# Patient Record
Sex: Male | Born: 2000 | Race: White | Hispanic: No | Marital: Single | Smoking: Never smoker
Health system: Southern US, Community
[De-identification: ages and names within clinical notes are randomized; demographics above are authoritative.]

---
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
## 2018-05-06 DIAGNOSIS — Q825 Congenital non-neoplastic nevus: Secondary | ICD-10-CM | POA: Diagnosis not present

## 2020-06-27 ENCOUNTER — Telehealth (INDEPENDENT_AMBULATORY_CARE_PROVIDER_SITE_OTHER): Payer: BLUE CROSS/BLUE SHIELD | Admitting: Adult Health

## 2020-06-27 ENCOUNTER — Encounter: Payer: Self-pay | Admitting: Adult Health

## 2020-06-27 DIAGNOSIS — M79604 Pain in right leg: Secondary | ICD-10-CM | POA: Insufficient documentation

## 2020-06-27 DIAGNOSIS — J069 Acute upper respiratory infection, unspecified: Secondary | ICD-10-CM | POA: Insufficient documentation

## 2020-06-27 NOTE — Patient Instructions (Signed)
[facility]  Located in: [facility] Address: [address], [place], [place] [address] Closes 8PM Phone: [phone]   Leg Cramps Leg cramps occur when one or more muscles tighten and you have no control over this tightening (involuntary muscle contraction). Muscle cramps can develop in any muscle, but the most common place is in the calf muscles of the leg. Those cramps can occur during exercise or when you are at rest. Leg cramps are painful, and they may last for a few seconds to a few minutes. Cramps may return several times before they finally stop. Usually, leg cramps are not caused by a serious medical problem. In many cases, the cause is not known. Some common causes include:  Excessive physical effort (overexertion), such as during intense exercise.  Overuse from repetitive motions, or doing the same thing over and over.  Staying in a certain position for a long period of time.  Improper preparation, form, or technique while performing a sport or an activity.  Dehydration.  Injury.  Side effects of certain medicines.  Abnormally low levels of minerals in your blood (electrolytes), especially potassium and calcium. This could result from: ? Pregnancy. ? Taking diuretic medicines. Follow these instructions at home: Eating and drinking  Drink enough fluid to keep your urine pale yellow. Staying hydrated may help prevent cramps.  Eat a healthy diet that includes plenty of nutrients to help your muscles function. A healthy diet includes fruits and vegetables, lean protein, whole grains, and low-fat or nonfat dairy products. Managing pain, stiffness, and swelling      Try massaging, stretching, and relaxing the affected muscle. Do this for several minutes at a time.  If directed, put ice on areas that are sore or painful after a cramp: ? Put ice in a plastic bag. ? Place a towel between your skin and the bag. ? Leave the ice on for 20  minutes, 2-3 times a day.  If directed, apply heat to muscles that are tense or tight. Do this before you exercise, or as often as told by your health care provider. Use the heat source that your health care provider recommends, such as a moist heat pack or a heating pad. ? Place a towel between your skin and the heat source. ? Leave the heat on for 20-30 minutes. ? Remove the heat if your skin turns bright red. This is especially important if you are unable to feel pain, heat, or cold. You may have a greater risk of getting burned.  Try taking hot showers or baths to help relax tight muscles. General instructions  If you are having frequent leg cramps, avoid intense exercise for several days.  Take over-the-counter and prescription medicines only as told by your health care provider.  Keep all follow-up visits as told by your health care provider. This is important. Contact a health care provider if:  Your leg cramps get more severe or more frequent, or they do not improve over time.  Your foot becomes cold, numb, or blue. Summary  Muscle cramps can develop in any muscle, but the most common place is in the calf muscles of the leg.  Leg cramps are painful, and they may last for a few seconds to a few minutes.  Usually, leg cramps are not caused by a serious medical problem. Often, the cause is not known.  Stay hydrated and take over-the-counter and prescription medicines only as told by your health care provider. This information is  not intended to replace advice given to you by your health care provider. Make sure you discuss any questions you have with your health care provider. Document Revised: 06/20/2017 Document Reviewed: 04/17/2017 Elsevier Patient Education  2020 [employer].

## 2020-06-27 NOTE — Progress Notes (Signed)
MyChart Video Visit    Virtual Visit via Video Note   This visit type was conducted due to national recommendations for restrictions regarding the COVID-19 Pandemic (e.g. social distancing) in an effort to limit this patient's exposure and mitigate transmission in our community. This patient is at least at moderate risk for complications without adequate follow up. This format is felt to be most appropriate for this patient at this time. Physical exam was limited by quality of the video and audio technology used for the visit.    Parties involved in visit as below:    Patient location: at home  Provider location: Provider: Provider's office at  [facility], [place].     I discussed the limitations of evaluation and management by telemedicine and the availability of in person appointments. The patient expressed understanding and agreed to proceed.  Patient: Joseph Braun   DOB: 2001/06/22   19 y.o. Male  MRN: [id number] Visit Date: 06/27/2020  Today's healthcare provider: Jairo Ben, FNP   Chief Complaint  Patient presents with  . Leg Pain   Subjective    Leg Pain  The incident occurred 12 to 24 hours ago. There was no injury mechanism. The pain is present in the right leg. The pain is moderate. Pertinent negatives include no inability to bear weight, loss of motion, loss of sensation, muscle weakness, numbness or tingling.   He had pain in right knee, and lower leg right. Denies any injury or trauma.  Denies any warmth or swelling.   He reports he has had a fever around 101.   He has a slight cough and tickle in his throat. Has headache.   He has not been seen in the office in over 2 years, previous PCP was Anola Gurney, [place]  Works in retail so many exposures.   Patient  denies any fever, body aches,chills, rash, chest pain, shortness of breath, nausea, vomiting, or diarrhea.     Patient Active Problem List   Diagnosis Date Noted    . Upper respiratory tract infection 06/27/2020  . Right leg pain 06/27/2020  . 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 Plantar wart 08/29/2009   History reviewed. No pertinent past medical history. Past Surgical History:  Procedure Laterality Date  . APPENDECTOMY  06/18/2010   Dr. Michela Pitcher   Social History   Tobacco Use  . Smoking status: Never Smoker  . Smokeless tobacco: Never Used  Substance Use Topics  . Alcohol use: No    Alcohol/week: 0.0 standard drinks  . Drug use: No   Social History   Socioeconomic History  . Marital status: Single    Spouse name: Not on file  . Number of children: Not on file  . Years of education: Not on file  . Highest education level: Not on file  Occupational History  . Not on file  Tobacco Use  . Smoking status: Never Smoker  . Smokeless tobacco: Never Used  Substance and Sexual Activity  . Alcohol use: No    Alcohol/week: 0.0 standard drinks  . Drug use: No  . Sexual activity: Never  Other Topics Concern  . Not on file  Social History Narrative  . Not on file   Social Determinants of Health   Financial Resource Strain:   . Difficulty of Paying Living Expenses: Not on file  Food Insecurity:   . Worried About Programme researcher, broadcasting/film/video in the Last Year: Not on file  . Ran Out of  Food in the Last Year: Not on file  Transportation Needs:   . Lack of Transportation (Medical): Not on file  . Lack of Transportation (Non-Medical): Not on file  Physical Activity:   . Days of Exercise per Week: Not on file  . Minutes of Exercise per Session: Not on file  Stress:   . Feeling of Stress : Not on file  Social Connections:   . Frequency of Communication with Friends and Family: Not on file  . Frequency of Social Gatherings with Friends and Family: Not on file  . Attends Religious Services: Not on file  . Active Member of Clubs or Organizations: Not on file  . Attends Banker Meetings: Not on file  . Marital Status: Not on file  Intimate  Partner Violence:   . Fear of Current or Ex-Partner: Not on file  . Emotionally Abused: Not on file  . Physically Abused: Not on file  . Sexually Abused: Not on file   Family Status  Relation Name Status  . PGF  Deceased       cancer  . Mother  Alive  . Father  Alive  . Brother ##Brother1 Alive   Family History  Problem Relation Age of Onset  . Cancer Paternal Grandfather        lung  . Healthy Mother   . Healthy Father   . Healthy Brother    No Known Allergies    Medications: No outpatient medications prior to visit.   No facility-administered medications prior to visit.    Review of Systems  Constitutional: Positive for activity change and fatigue. Negative for appetite change, chills, diaphoresis, fever and unexpected weight change.  Respiratory: Positive for cough. Negative for apnea, choking, chest tightness, shortness of breath, wheezing and stridor.   Cardiovascular: Negative.   Gastrointestinal: Negative.   Genitourinary: Negative.   Musculoskeletal:       Right leg pain. No injury. Knee to lower leg.   Skin: Negative for color change and rash.  Neurological: Negative.  Negative for tingling and numbness.  Psychiatric/Behavioral: Negative.       Objective    There were no vitals taken for this visit.   Physical Exam    Patient is alert and oriented and responsive to questions Engages in conversation with provider. Speaks in full sentences without any pauses without any shortness of breath or distress.     Assessment & Plan     Right leg pain  Upper respiratory tract infection, unspecified type   He is going to go to [facility] urgent care for in person leg evaluation to be sure no DVT work up needed. Can be tested for Covid, flu at urgent care and evaluation.  He will go to [facility] urgent care now.   Red Flags discussed. The patient was given clear instructions to go to ER or return to medical center if any red flags develop, symptoms do not  improve, worsen or new problems develop. They verbalized understanding.  Follow up with office after is advised.    Recommend follow up with new provider to establish care since [place], [place] no longer at office.   Return in about 1 week (around 07/04/2020), or if symptoms worsen or fail to improve, for at any time for any worsening symptoms, Go to Emergency room/ urgent care if worse.     I discussed the assessment and treatment plan with the patient. The patient was provided an opportunity to ask questions and all were  answered. The patient agreed with the plan and demonstrated an understanding of the instructions.   The patient was advised to call back or seek an in-person evaluation if the symptoms worsen or if the condition fails to improve as anticipated.    Jairo Ben, FNP [facility] [phone] (phone) [phone] (fax)  [facility]

## 2020-06-28 ENCOUNTER — Ambulatory Visit
Admission: RE | Admit: 2020-06-28 | Discharge: 2020-06-28 | Disposition: A | Discharge status: Home / Self Care | Payer: BC Managed Care – PPO | Source: Ambulatory Visit | Attending: Family Medicine | Admitting: Family Medicine

## 2020-06-28 ENCOUNTER — Other Ambulatory Visit: Payer: Self-pay

## 2020-06-28 ENCOUNTER — Ambulatory Visit (INDEPENDENT_AMBULATORY_CARE_PROVIDER_SITE_OTHER): Payer: BC Managed Care – PPO

## 2020-06-28 VITALS — BP 124/95 | HR 89 | Temp 97.9°F | Resp 18 | Ht 76.0 in | Wt 260.0 lb

## 2020-06-28 DIAGNOSIS — R509 Fever, unspecified: Secondary | ICD-10-CM

## 2020-06-28 DIAGNOSIS — U071 COVID-19: Secondary | ICD-10-CM

## 2020-06-28 DIAGNOSIS — R059 Cough, unspecified: Secondary | ICD-10-CM

## 2020-06-28 LAB — RESP PANEL BY RT-PCR (FLU A&B, COVID) ARPGX2
Influenza A by PCR: NEGATIVE
Influenza B by PCR: NEGATIVE
SARS Coronavirus 2 by RT PCR: POSITIVE — AB

## 2020-06-28 MED ORDER — PROMETHAZINE-DM 6.25-15 MG/5ML PO SYRP: 5.0000 mL | ORAL_SOLUTION | Freq: Four times a day (QID) | ORAL | 0 refills | Status: AC | PRN

## 2020-06-28 MED ORDER — BENZONATATE 100 MG PO CAPS: 200.0000 mg | ORAL_CAPSULE | Freq: Three times a day (TID) | ORAL | 0 refills | Status: AC

## 2020-06-28 NOTE — ED Triage Notes (Signed)
Patient states that he has been having a fever, joint pain, headache and fatigue x 2 days.

## 2020-06-28 NOTE — Discharge Instructions (Signed)
You were positive for Covid.  You need to quarantine for 10 days from the start of your symptoms.  After the 10 days you can break quarantine if your symptoms have improved and you have not had a fever for 24 hours without the use of Tylenol or Advil.  Use the Tessalon Perles every 8 hours as needed for cough.  You can use the Promethazine DM every 6 hours as needed for severe cough and congestion.  Use over-the-counter Tylenol and ibuprofen as needed for body aches, fever, and headache.  If you develop worsening of symptoms, especially shortness of breath or shortness of breath at rest, you cannot speak in full sentences, or you develop bluing of your lips you need to go to the ER for evaluation.

## 2020-06-28 NOTE — ED Provider Notes (Signed)
[facility]    CSN: [id number] Arrival date & time: 06/28/20  1148      History   Chief Complaint Chief Complaint  Patient presents with  . Fever    HPI Joseph Braun is a 19 y.o. male.   HPI   19 year old male here for evaluation of fever, headache, sinus pressure, runny nose, ear pressure, sore throat, dry cough, fatigue, nausea, and changes to his sense of taste and smell.  Patient denies shortness of breath or wheezing.  Patient also denies vomiting or diarrhea.  Patient has not had his Covid vaccine or his flu shot.  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  History reviewed. No pertinent past medical history.  Patient Active Problem List   Diagnosis Date Noted  . Upper respiratory tract infection 06/27/2020  . Right leg pain 06/27/2020  . 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 Plantar wart 08/29/2009    Past Surgical History:  Procedure Laterality Date  . APPENDECTOMY  06/18/2010   Dr. Michela Pitcher       Home Medications    Prior to Admission medications   Medication Sig Start Date End Date Taking? Authorizing Provider  benzonatate (TESSALON) 100 MG capsule Take 2 capsules (200 mg total) by mouth every 8 (eight) hours. 06/28/20   Becky Augusta, NP  promethazine-dextromethorphan (PROMETHAZINE-DM) 6.25-15 MG/5ML syrup Take 5 mLs by mouth 4 (four) times daily as needed. 06/28/20   Becky Augusta, NP    Family History Family History  Problem Relation Age of Onset  . Cancer Paternal Grandfather        lung  . Healthy Mother   . Healthy Father   . Healthy Brother     Social History Social History   Tobacco Use  . Smoking status: Never Smoker  . Smokeless tobacco: Never Used  Vaping Use  . Vaping Use: Never used  Substance Use Topics  . Alcohol use: No    Alcohol/week: 0.0 standard drinks  . Drug use: No     Allergies   Patient has no known allergies.   Review of Systems Review of Systems  Constitutional: Positive for  fatigue and fever. Negative for activity change and appetite change.  HENT: Positive for congestion, ear pain, rhinorrhea, sinus pressure and sore throat.   Respiratory: Positive for cough. Negative for shortness of breath and wheezing.   Cardiovascular: Negative for chest pain.  Gastrointestinal: Positive for nausea. Negative for diarrhea and vomiting.  Musculoskeletal: Positive for arthralgias and myalgias.  Skin: Negative for rash.  Neurological: Positive for headaches.  Hematological: Negative.   Psychiatric/Behavioral: Negative.      Physical Exam Triage Vital Signs ED Triage Vitals  Enc Vitals Group     BP 06/28/20 1251 (!) 124/95     Pulse Rate 06/28/20 1251 89     Resp 06/28/20 1251 18     Temp 06/28/20 1251 97.9 F (36.6 C)     Temp Source 06/28/20 1251 Oral     SpO2 06/28/20 1251 95 %     Weight 06/28/20 1250 260 lb (117.9 kg)     Height 06/28/20 1250 6\' 4"  (1.93 m)     Head Circumference --      Peak Flow --      Pain Score 06/28/20 1250 4     Pain Loc --      Pain Edu? --      Excl. in GC? --    No data found.  Updated Vital Signs BP (!) 124/95 (BP Location: Left Arm)   Pulse 89   Temp 97.9 F (36.6 C) (Oral)   Resp 18   Ht 6\' 4"  (1.93 m)   Wt 260 lb (117.9 kg)   SpO2 95%   BMI 31.65 kg/m   Visual Acuity Right Eye Distance:   Left Eye Distance:   Bilateral Distance:    Right Eye Near:   Left Eye Near:    Bilateral Near:     Physical Exam Vitals and nursing note reviewed.  Constitutional:      General: He is not in acute distress.    Appearance: Normal appearance. He is normal weight. He is not toxic-appearing.  HENT:     Head: Normocephalic and atraumatic.     Ears:     Comments: Patient has copious amounts of dark cerumen in both canals.  The superior rim of the tympanic membrane bilaterally can be visualized.  No appreciable erythema.    Nose: Congestion and rhinorrhea present.     Comments: Nasal mucosa is erythematous and edematous  with clear nasal discharge.    Mouth/Throat:     Pharynx: Oropharynx is clear. Posterior oropharyngeal erythema present. No oropharyngeal exudate.     Comments: Posterior oropharynx is erythematous with clear postnasal drip. Eyes:     General: No scleral icterus.    Extraocular Movements: Extraocular movements intact.     Conjunctiva/sclera: Conjunctivae normal.     Pupils: Pupils are equal, round, and reactive to light.  Cardiovascular:     Rate and Rhythm: Normal rate and regular rhythm.     Pulses: Normal pulses.     Heart sounds: Normal heart sounds. No murmur heard.  No gallop.   Pulmonary:     Effort: Pulmonary effort is normal. No respiratory distress.     Comments: Patient has decreased lung sounds in all fields. Musculoskeletal:        General: No swelling or tenderness. Normal range of motion.     Cervical back: Normal range of motion and neck supple.  Lymphadenopathy:     Cervical: No cervical adenopathy.  Skin:    General: Skin is warm and dry.     Capillary Refill: Capillary refill takes less than 2 seconds.     Findings: No erythema or rash.  Neurological:     General: No focal deficit present.     Mental Status: He is alert and oriented to person, place, and time.  Psychiatric:        Mood and Affect: Mood normal.        Behavior: Behavior normal.        Thought Content: Thought content normal.        Judgment: Judgment normal.      UC Treatments / Results  Labs (all labs ordered are listed, but only abnormal results are displayed) Labs Reviewed  RESP PANEL BY RT-PCR (FLU A&B, COVID) ARPGX2 - Abnormal; Notable for the following components:      Result Value   SARS Coronavirus 2 by RT PCR POSITIVE (*)    All other components within normal limits    EKG   Radiology DG Chest 2 View  Result Date: 06/28/2020 CLINICAL DATA:  Fever and cough for 2 days EXAM: CHEST - 2 VIEW COMPARISON:  None. FINDINGS: Normal heart size. Normal mediastinal contour. No  pneumothorax. No pleural effusion. Lungs appear clear, with no acute consolidative airspace disease and no pulmonary edema. IMPRESSION: No active cardiopulmonary disease. Electronically Signed  By: Delbert Phenix M.D.   On: 06/28/2020 13:54    Procedures Procedures (including critical care time)  Medications Ordered in UC Medications - No data to display  Initial Impression / Assessment and Plan / UC Course  I have reviewed the triage vital signs and the nursing notes.  Pertinent labs & imaging results that were available during my care of the patient were reviewed by me and considered in my medical decision making (see chart for details).   Patient is here for evaluation of Covid-like symptoms that started 2 days ago.  Patient physical exam reveals upper airway erythema and edema with clear nasal discharge and clear postnasal drip.  Lung sounds are diminished in all fields.  Patient denies shortness of breath and is not tachypneic.  SPO2 is 95%.  Will send triplex respiratory panel and obtain chest x-ray.  Respiratory panel is positive for Covid.  Chest x-ray negative for pneumonia.  There is a calcified area in the right hilum and what looks like scattered groundglass opacities in the right upper middle and lower lungs.  Will await radiology overread.  Radiology over read states no active cardio pulmonary disease.  We will discharge patient home with a diagnosis of Covid and send prescription for Tessalon Perles and Promethazine DM for cough and congestion control.   Final Clinical Impressions(s) / UC Diagnoses   Final diagnoses:  COVID-19     Discharge Instructions     You were positive for Covid.  You need to quarantine for 10 days from the start of your symptoms.  After the 10 days you can break quarantine if your symptoms have improved and you have not had a fever for 24 hours without the use of Tylenol or Advil.  Use the Tessalon Perles every 8 hours as needed for  cough.  You can use the Promethazine DM every 6 hours as needed for severe cough and congestion.  Use over-the-counter Tylenol and ibuprofen as needed for body aches, fever, and headache.  If you develop worsening of symptoms, especially shortness of breath or shortness of breath at rest, you cannot speak in full sentences, or you develop bluing of your lips you need to go to the ER for evaluation.    ED Prescriptions    Medication Sig Dispense Auth. Provider   benzonatate (TESSALON) 100 MG capsule Take 2 capsules (200 mg total) by mouth every 8 (eight) hours. 21 capsule Becky Augusta, NP   promethazine-dextromethorphan (PROMETHAZINE-DM) 6.25-15 MG/5ML syrup Take 5 mLs by mouth 4 (four) times daily as needed. 118 mL Becky Augusta, NP     PDMP not reviewed this encounter.   Becky Augusta, NP 06/28/20 1409

## 2021-10-09 IMAGING — CR DG CHEST 2V
2 series · 3 of 3 positions shown · non-contrast
Comparison: None.

CLINICAL DATA: Fever and cough for 2 days

EXAM:
CHEST - 2 VIEW

[chest pa]
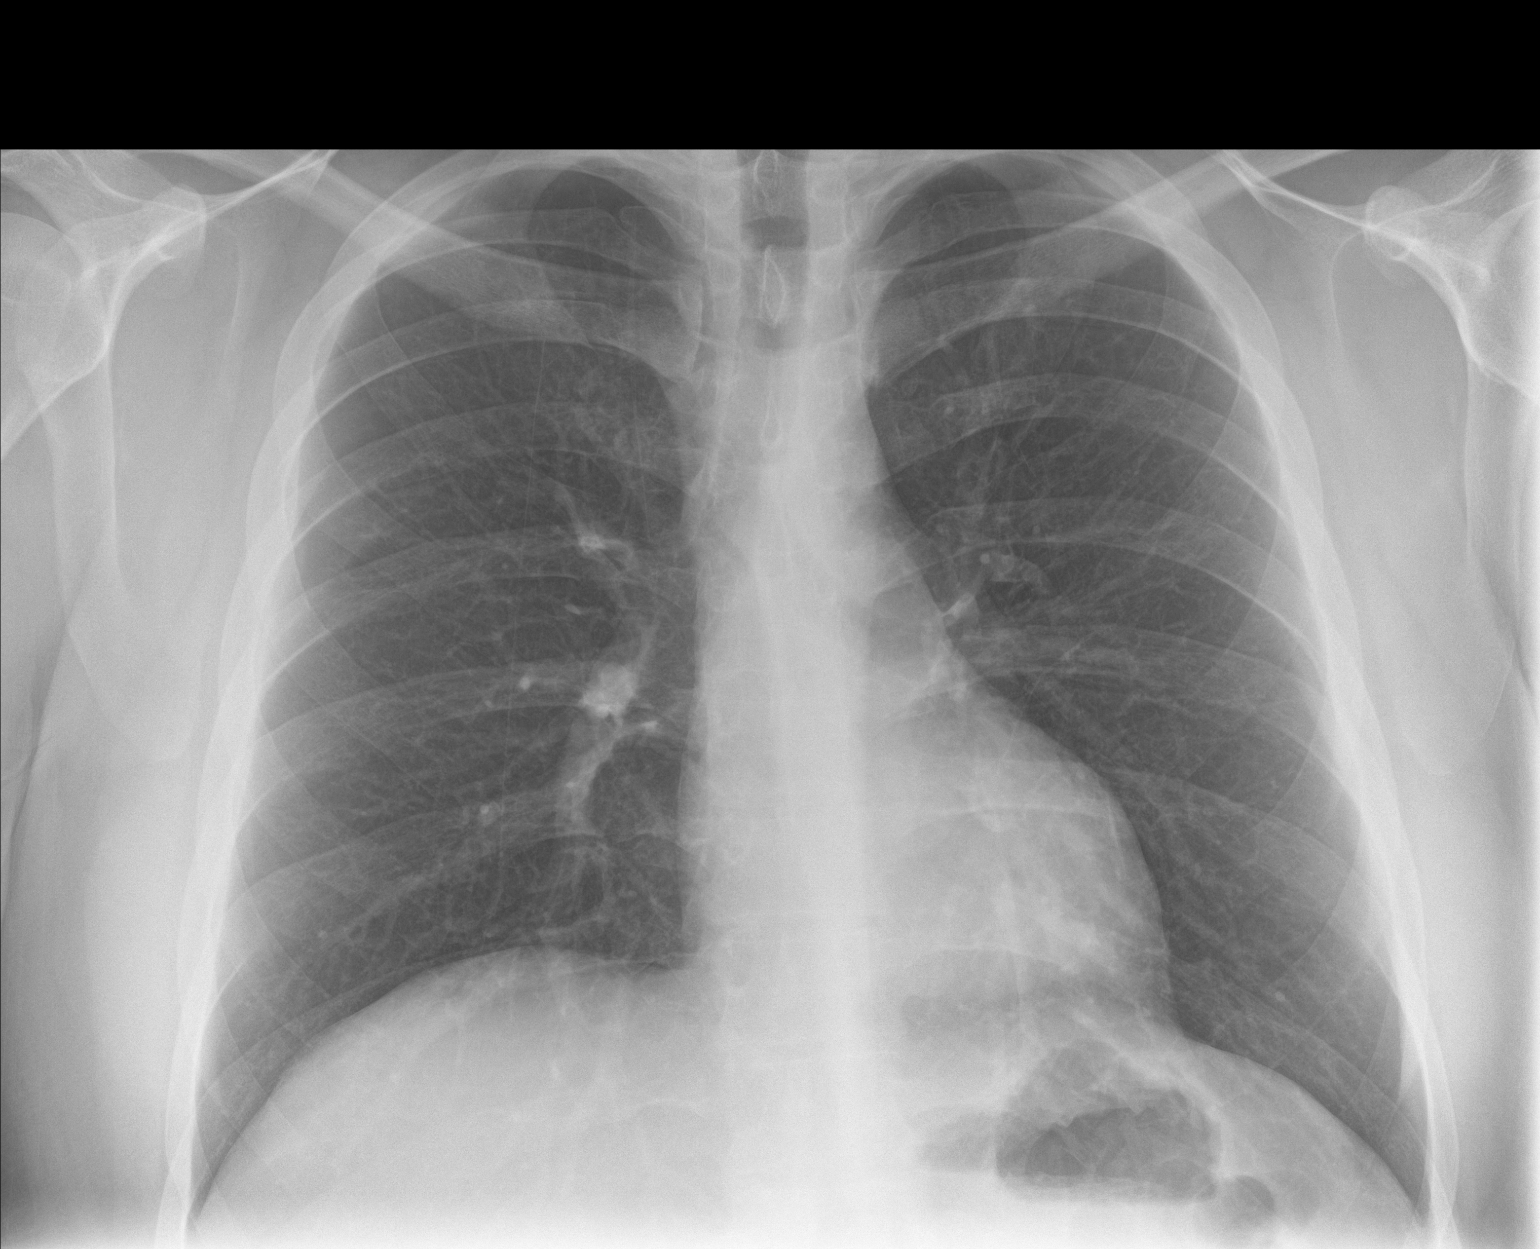

[Series 2: chest lat · 0.14mm/px · 2 of 2 slices shown]
[im 1/2]
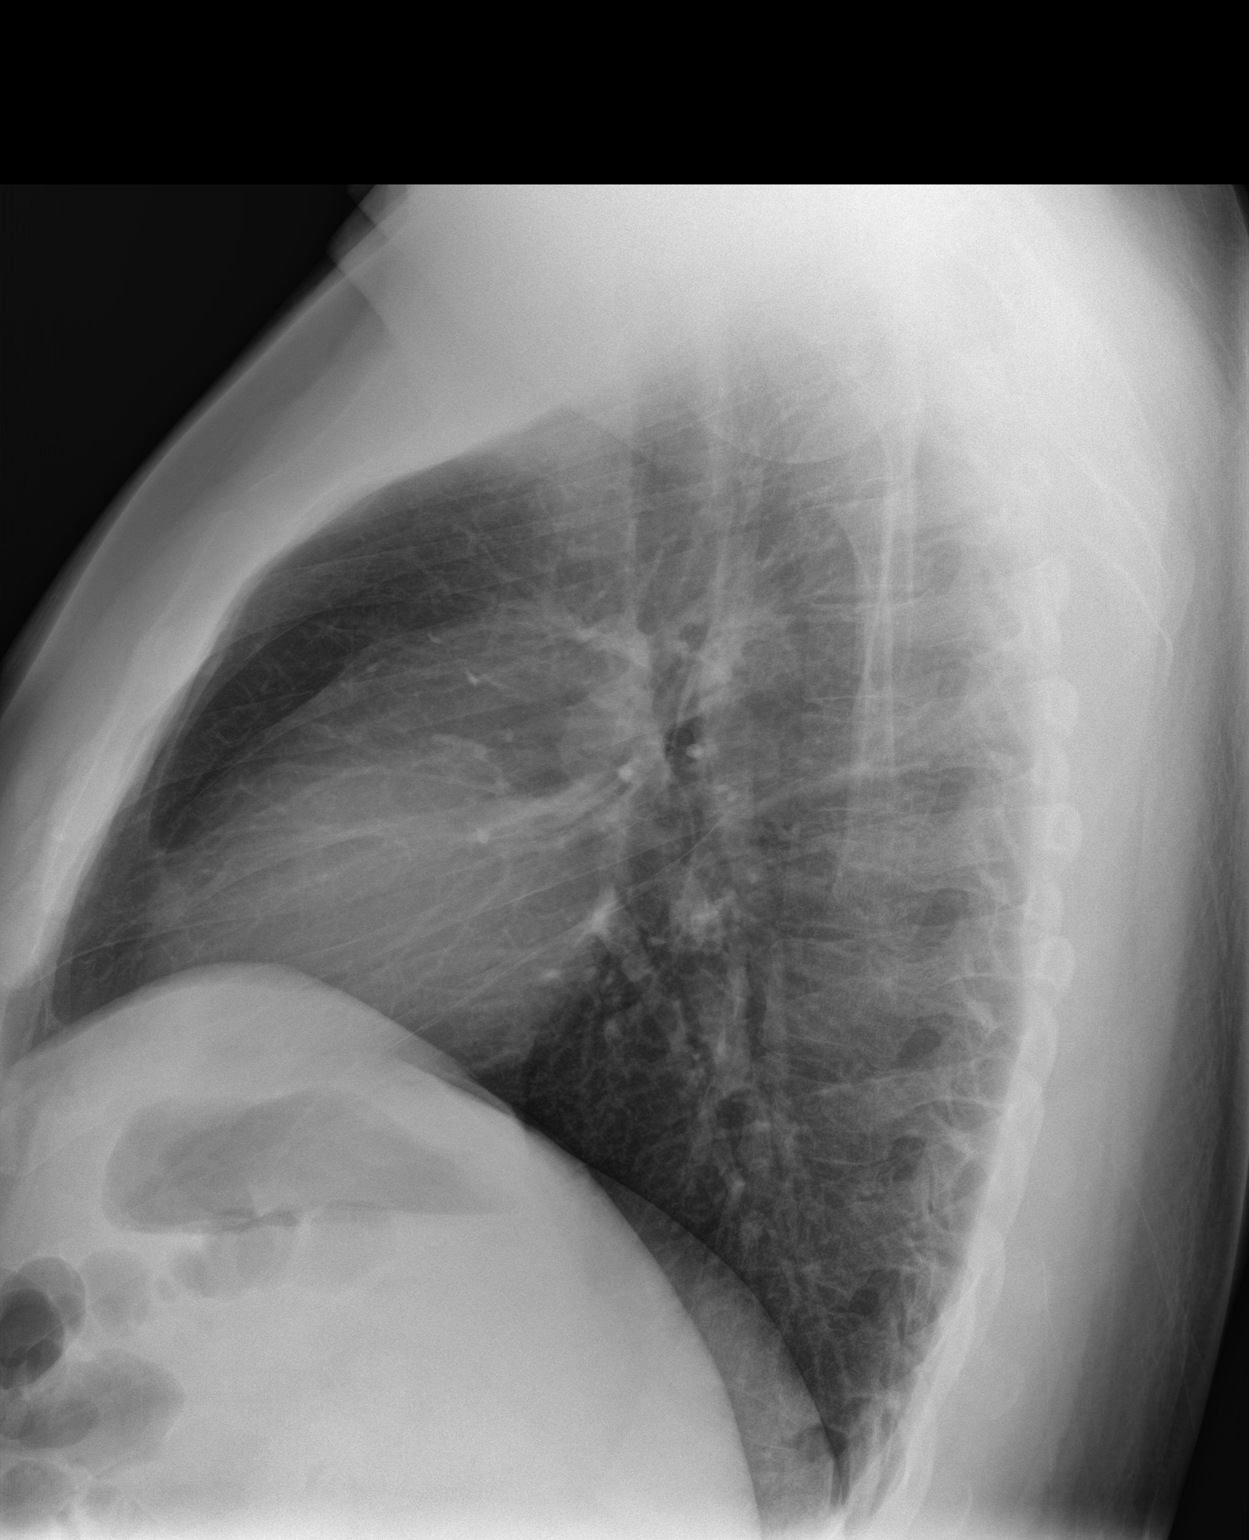
[im 2/2]
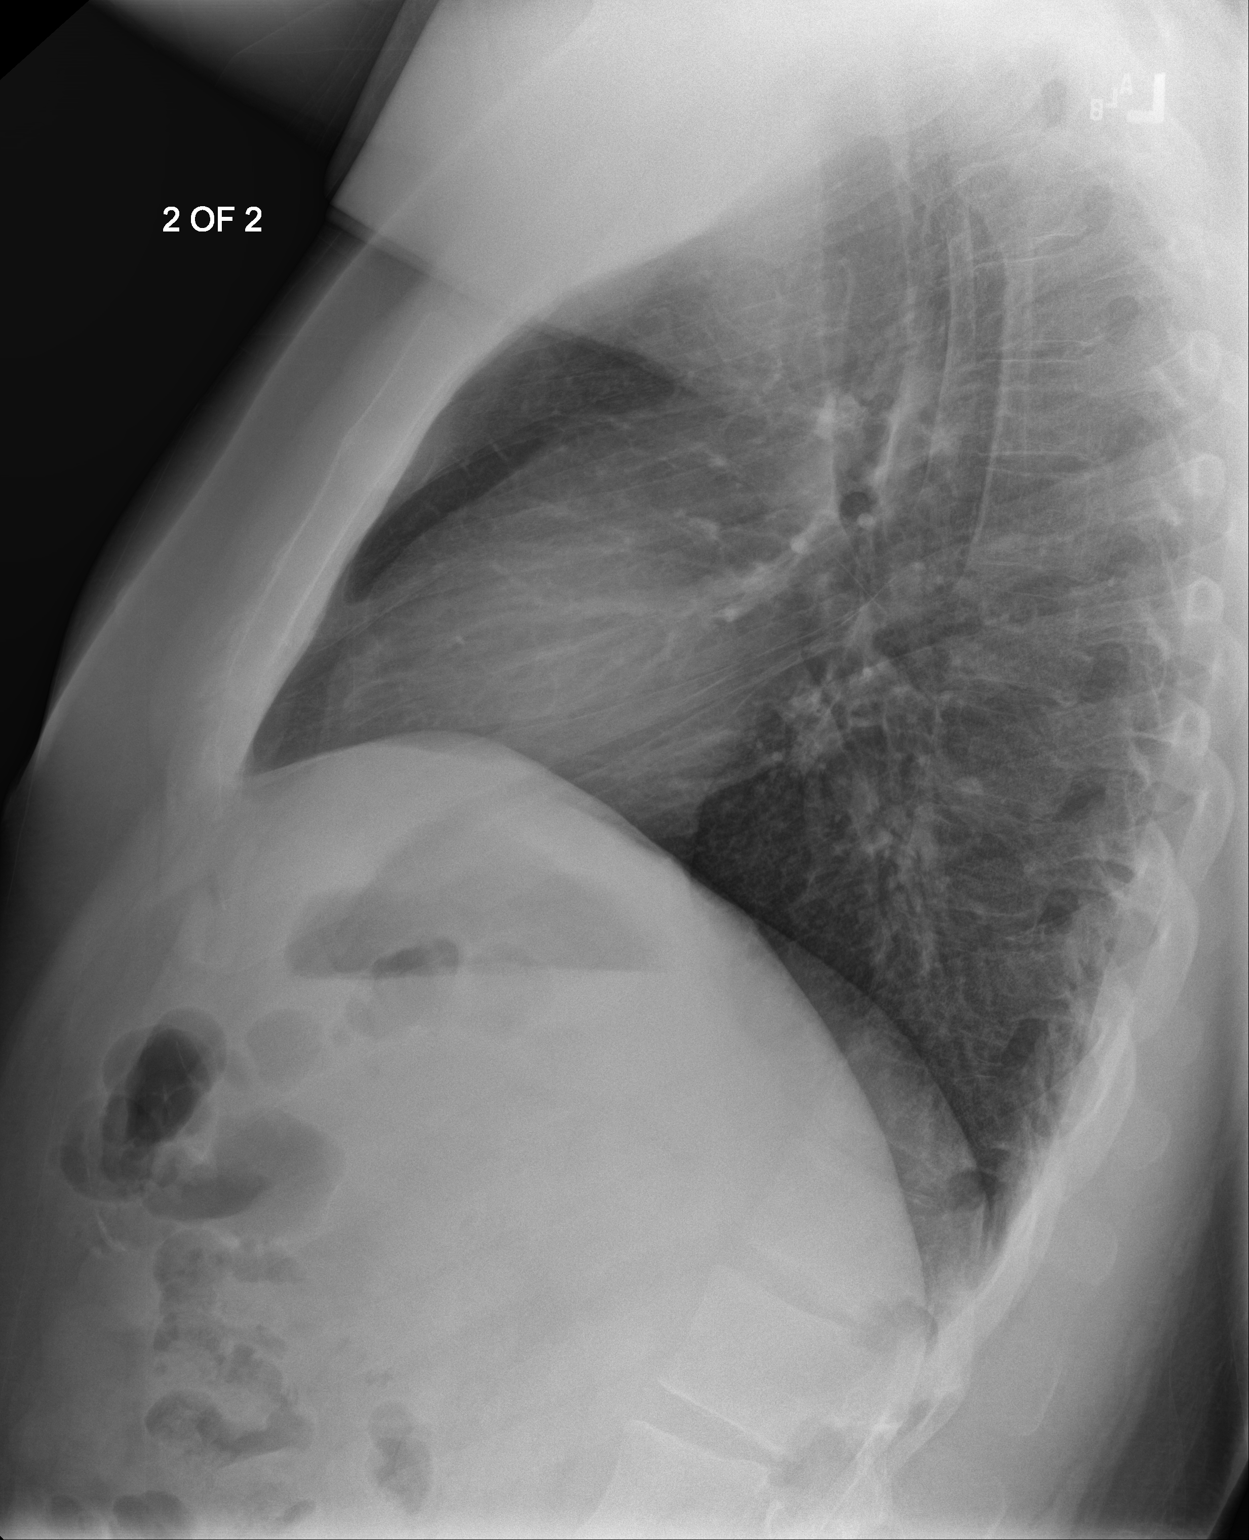

[3 of 3 positions shown; findings below may reference images not displayed]

FINDINGS: Normal heart size. Normal mediastinal contour. No pneumothorax. No
pleural effusion. Lungs appear clear, with no acute consolidative
airspace disease and no pulmonary edema.
IMPRESSION: No active cardiopulmonary disease.
